# Patient Record
Sex: Female | Born: 1975 | Race: White | Hispanic: No | Marital: Married | Smoking: Never smoker
Health system: Southern US, Community
[De-identification: ages and names within clinical notes are randomized; demographics above are authoritative.]

## PROBLEM LIST (undated history)

## (undated) HISTORY — PX: CHOLECYSTECTOMY: SHX55

---

## 2015-05-21 ENCOUNTER — Emergency Department: Payer: BC Managed Care – PPO

## 2015-05-21 ENCOUNTER — Emergency Department
Admission: EM | Admit: 2015-05-21 | Discharge: 2015-05-21 | Disposition: A | Discharge status: Home / Self Care | Payer: BC Managed Care – PPO | Attending: Emergency Medicine | Admitting: Emergency Medicine

## 2015-05-21 ENCOUNTER — Encounter: Payer: Self-pay | Admitting: Emergency Medicine

## 2015-05-21 DIAGNOSIS — R202 Paresthesia of skin: Secondary | ICD-10-CM | POA: Diagnosis not present

## 2015-05-21 DIAGNOSIS — R079 Chest pain, unspecified: Secondary | ICD-10-CM | POA: Insufficient documentation

## 2015-05-21 LAB — BASIC METABOLIC PANEL
Anion gap: 7 (ref 5–15)
BUN: 12 mg/dL (ref 6–20)
CHLORIDE: 108 mmol/L (ref 101–111)
CO2: 24 mmol/L (ref 22–32)
CREATININE: 0.78 mg/dL (ref 0.44–1.00)
Calcium: 9.3 mg/dL (ref 8.9–10.3)
GFR calc Af Amer: >60 mL/min (ref >60)
GFR calc non Af Amer: >60 mL/min (ref >60)
GLUCOSE: 107 mg/dL — AB (ref 65–99)
Potassium: 3.6 mmol/L (ref 3.5–5.1)
SODIUM: 139 mmol/L (ref 135–145)

## 2015-05-21 LAB — CBC
HEMATOCRIT: 39.7 % (ref 35.0–47.0)
Hemoglobin: 13.5 g/dL (ref 12.0–16.0)
MCH: 31.6 pg (ref 26.0–34.0)
MCHC: 34.1 g/dL (ref 32.0–36.0)
MCV: 92.6 fL (ref 80.0–100.0)
PLATELETS: 228 10*3/uL (ref 150–440)
RBC: 4.29 MIL/uL (ref 3.80–5.20)
RDW: 12.5 % (ref 11.5–14.5)
WBC: 6.3 10*3/uL (ref 3.6–11.0)

## 2015-05-21 LAB — LIPID PANEL
Cholesterol: 138 mg/dL (ref 0–200)
HDL: 56 mg/dL (ref >40)
LDL CALC: 72 mg/dL (ref 0–99)
Total CHOL/HDL Ratio: 2.5 RATIO
Triglycerides: 48 mg/dL (ref <150)
VLDL: 10 mg/dL (ref 0–40)

## 2015-05-21 LAB — TROPONIN I: Troponin I: <0.03 ng/mL (ref <0.031)

## 2015-05-21 NOTE — ED Notes (Signed)
Pt brought in via triage w/ complaints of intermittent chest pain x3 days and a dull ache radiating through left arm. Pt A/Ox4, vital signs WDL, no signs of immediate distress at this time.

## 2015-05-21 NOTE — Discharge Instructions (Signed)
Nonspecific Chest Pain  °Chest pain can be caused by many different conditions. There is always a chance that your pain could be related to something serious, such as a heart attack or a blood clot in your lungs. Chest pain can also be caused by conditions that are not life-threatening. If you have chest pain, it is very important to follow up with your health care provider. °CAUSES  °Chest pain can be caused by: °· Heartburn. °· Pneumonia or bronchitis. °· Anxiety or stress. °· Inflammation around your heart (pericarditis) or lung (pleuritis or pleurisy). °· A blood clot in your lung. °· A collapsed lung (pneumothorax). It can develop suddenly on its own (spontaneous pneumothorax) or from trauma to the chest. °· Shingles infection (varicella-zoster virus). °· Heart attack. °· Damage to the bones, muscles, and cartilage that make up your chest wall. This can include: °¨ Bruised bones due to injury. °¨ Strained muscles or cartilage due to frequent or repeated coughing or overwork. °¨ Fracture to one or more ribs. °¨ Sore cartilage due to inflammation (costochondritis). °RISK FACTORS  °Risk factors for chest pain may include: °· Activities that increase your risk for trauma or injury to your chest. °· Respiratory infections or conditions that cause frequent coughing. °· Medical conditions or overeating that can cause heartburn. °· Heart disease or family history of heart disease. °· Conditions or health behaviors that increase your risk of developing a blood clot. °· Having had chicken pox (varicella zoster). °SIGNS AND SYMPTOMS °Chest pain can feel like: °· Burning or tingling on the surface of your chest or deep in your chest. °· Crushing, pressure, aching, or squeezing pain. °· Dull or sharp pain that is worse when you move, cough, or take a deep breath. °· Pain that is also felt in your back, neck, shoulder, or arm, or pain that spreads to any of these areas. °Your chest pain may come and go, or it may stay  constant. °DIAGNOSIS °Lab tests or other studies may be needed to find the cause of your pain. Your health care provider may have you take a test called an ambulatory ECG (electrocardiogram). An ECG records your heartbeat patterns at the time the test is performed. You may also have other tests, such as: °· Transthoracic echocardiogram (TTE). During echocardiography, sound waves are used to create a picture of all of the heart structures and to look at how blood flows through your heart. °· Transesophageal echocardiogram (TEE). This is a more advanced imaging test that obtains images from inside your body. It allows your health care provider to see your heart in finer detail. °· Cardiac monitoring. This allows your health care provider to monitor your heart rate and rhythm in real time. °· Holter monitor. This is a portable device that records your heartbeat and can help to diagnose abnormal heartbeats. It allows your health care provider to track your heart activity for several days, if needed. °· Stress tests. These can be done through exercise or by taking medicine that makes your heart beat more quickly. °· Blood tests. °· Imaging tests. °TREATMENT  °Your treatment depends on what is causing your chest pain. Treatment may include: °· Medicines. These may include: °¨ Acid blockers for heartburn. °¨ Anti-inflammatory medicine. °¨ Pain medicine for inflammatory conditions. °¨ Antibiotic medicine, if an infection is present. °¨ Medicines to dissolve blood clots. °¨ Medicines to treat coronary artery disease. °· Supportive care for conditions that do not require medicines. This may include: °¨ Resting. °¨ Applying heat   or cold packs to injured areas. °¨ Limiting activities until pain decreases. °HOME CARE INSTRUCTIONS °· If you were prescribed an antibiotic medicine, finish it all even if you start to feel better. °· Avoid any activities that bring on chest pain. °· Do not use any tobacco products, including  cigarettes, chewing tobacco, or electronic cigarettes. If you need help quitting, ask your health care provider. °· Do not drink alcohol. °· Take medicines only as directed by your health care provider. °· Keep all follow-up visits as directed by your health care provider. This is important. This includes any further testing if your chest pain does not go away. °· If heartburn is the cause for your chest pain, you may be told to keep your head raised (elevated) while sleeping. This reduces the chance that acid will go from your stomach into your esophagus. °· Make lifestyle changes as directed by your health care provider. These may include: °¨ Getting regular exercise. Ask your health care provider to suggest some activities that are safe for you. °¨ Eating a heart-healthy diet. A registered dietitian can help you to learn healthy eating options. °¨ Maintaining a healthy weight. °¨ Managing diabetes, if necessary. °¨ Reducing stress. °SEEK MEDICAL CARE IF: °· Your chest pain does not go away after treatment. °· You have a rash with blisters on your chest. °· You have a fever. °SEEK IMMEDIATE MEDICAL CARE IF:  °· Your chest pain is worse. °· You have an increasing cough, or you cough up blood. °· You have severe abdominal pain. °· You have severe weakness. °· You faint. °· You have chills. °· You have sudden, unexplained chest discomfort. °· You have sudden, unexplained discomfort in your arms, back, neck, or jaw. °· You have shortness of breath at any time. °· You suddenly start to sweat, or your skin gets clammy. °· You feel nauseous or you vomit. °· You suddenly feel light-headed or dizzy. °· Your heart begins to beat quickly, or it feels like it is skipping beats. °These symptoms may represent a serious problem that is an emergency. Do not wait to see if the symptoms will go away. Get medical help right away. Call your local emergency services (911 in the [place] Do not drive yourself to the hospital. °  °This  information is not intended to replace advice given to you by your health care provider. Make sure you discuss any questions you have with your health care provider. °  °Document Released: 04/20/2005 Document Revised: 08/01/2014 Document Reviewed: 02/14/2014 °Elsevier Interactive Patient Education ©2016 [employer] ° °

## 2015-05-21 NOTE — ED Notes (Signed)
Pt discharged home after verbalizing understanding of discharge instructions; nad noted. 

## 2015-05-21 NOTE — ED Provider Notes (Signed)
[facility] Emergency Department Provider Note     Time seen: ----------------------------------------- 10:18 AM on 05/21/2015 -----------------------------------------    I have reviewed the triage vital signs and the nursing notes.   HISTORY  Chief Complaint Chest Pain    HPI Tanya Patel is a 39 y.o. female who presents ER with chest pain for 3 days it's intermittent and dull. Patient noted some paresthesias in her left arm today, was concerned and presentsfor further evaluation concerning same. She's not had his this before, nothing makes it better or worse.   No past medical history on file.  There are no active problems to display for this patient.   Past Surgical History  Procedure Laterality Date  . Cholecystectomy      Allergies Codeine; Pineapple; and Sulfa antibiotics  Social History Social History  Substance Use Topics  . Smoking status: Never Smoker   . Smokeless tobacco: Not on file  . Alcohol Use: Yes    Review of Systems Constitutional: Negative for fever. Eyes: Negative for visual changes. ENT: Negative for sore throat. Cardiovascular: Positive for chest pain Respiratory: Negative for shortness of breath. Gastrointestinal: Negative for abdominal pain, vomiting and diarrhea. Genitourinary: Negative for dysuria. Musculoskeletal: Negative for back pain. Skin: Negative for rash. Neurological: Negative for headaches, positive for left arm paresthesia  10-point ROS otherwise negative.  ____________________________________________   PHYSICAL EXAM:  VITAL SIGNS: ED Triage Vitals  Enc Vitals Group     BP 05/21/15 0952 122/81 mmHg     Pulse Rate 05/21/15 0952 62     Resp 05/21/15 0952 20     Temp 05/21/15 0952 98 F (36.7 C)     Temp Source 05/21/15 0952 Oral     SpO2 05/21/15 0952 99 %     Weight 05/21/15 0952 180 lb (81.647 kg)     Height 05/21/15 0952  (1.676 m)     Head Cir --      Peak  Flow --      Pain Score 05/21/15 0952 3     Pain Loc --      Pain Edu? --      Excl. in GC? --     Constitutional: Alert and oriented. Well appearing and in no distress. Eyes: Conjunctivae are normal. PERRL. Normal extraocular movements. ENT   Head: Normocephalic and atraumatic.   Nose: No congestion/rhinnorhea.   Mouth/Throat: Mucous membranes are moist.   Neck: No stridor. Cardiovascular: Normal rate, regular rhythm. Normal and symmetric distal pulses are present in all extremities. No murmurs, rubs, or gallops. Respiratory: Normal respiratory effort without tachypnea nor retractions. Breath sounds are clear and equal bilaterally. No wheezes/rales/rhonchi. Gastrointestinal: Soft and nontender. No distention. No abdominal bruits.  Musculoskeletal: Nontender with normal range of motion in all extremities. No joint effusions.  No lower extremity tenderness nor edema. Neurologic:  Normal speech and language. No gross focal neurologic deficits are appreciated. Speech is normal. No gait instability. Skin:  Skin is warm, dry and intact. No rash noted. Psychiatric: Mood and affect are normal. Speech and behavior are normal. Patient exhibits appropriate insight and judgment. ____________________________________________  EKG: Interpreted by me. Normal sinus rhythm with a rate of 70 bpm, normal PR interval, normal QRS with, normal QT interval.  ____________________________________________  ED COURSE:  Pertinent labs & imaging results that were available during my care of the patient were reviewed by me and considered in my medical decision making (see chart for details).  ____________________________________________    Vickie Epley (  pertinent positives/negatives)  Labs Reviewed  BASIC METABOLIC PANEL - Abnormal; Notable for the following:    Glucose, Bld 107 (*)    All other components within normal limits  CBC  TROPONIN I  LIPID PANEL    RADIOLOGY Images were viewed by  me  Chest x-ray  IMPRESSION: No active cardiopulmonary disease. ____________________________________________  FINAL ASSESSMENT AND PLAN  Chest pain  Plan: Patient with labs and imaging as dictated above. Patient is low risk according to heart score. Her labs including cholesterol panel looked normal. She is stable for outpatient follow-up with her doctor.   Emily FilbertWilliams, Janille Draughon E, MD   Emily FilbertJonathan E Edu On, MD 05/21/15 [phone]

## 2015-05-21 NOTE — ED Notes (Signed)
Pt reports 3 days ago started with some chest pain that is intermittent and achy in nature. Pt reports pain radiates into her left arm and causes numbness. Pt reports just doesn't feel well.

## 2016-11-19 IMAGING — CR DG CHEST 2V
2 series · 2 of 2 positions shown · non-contrast
Comparison: None.

CLINICAL DATA: Chest pain for 3 days

EXAM:
CHEST  2 VIEW

[chest pa]
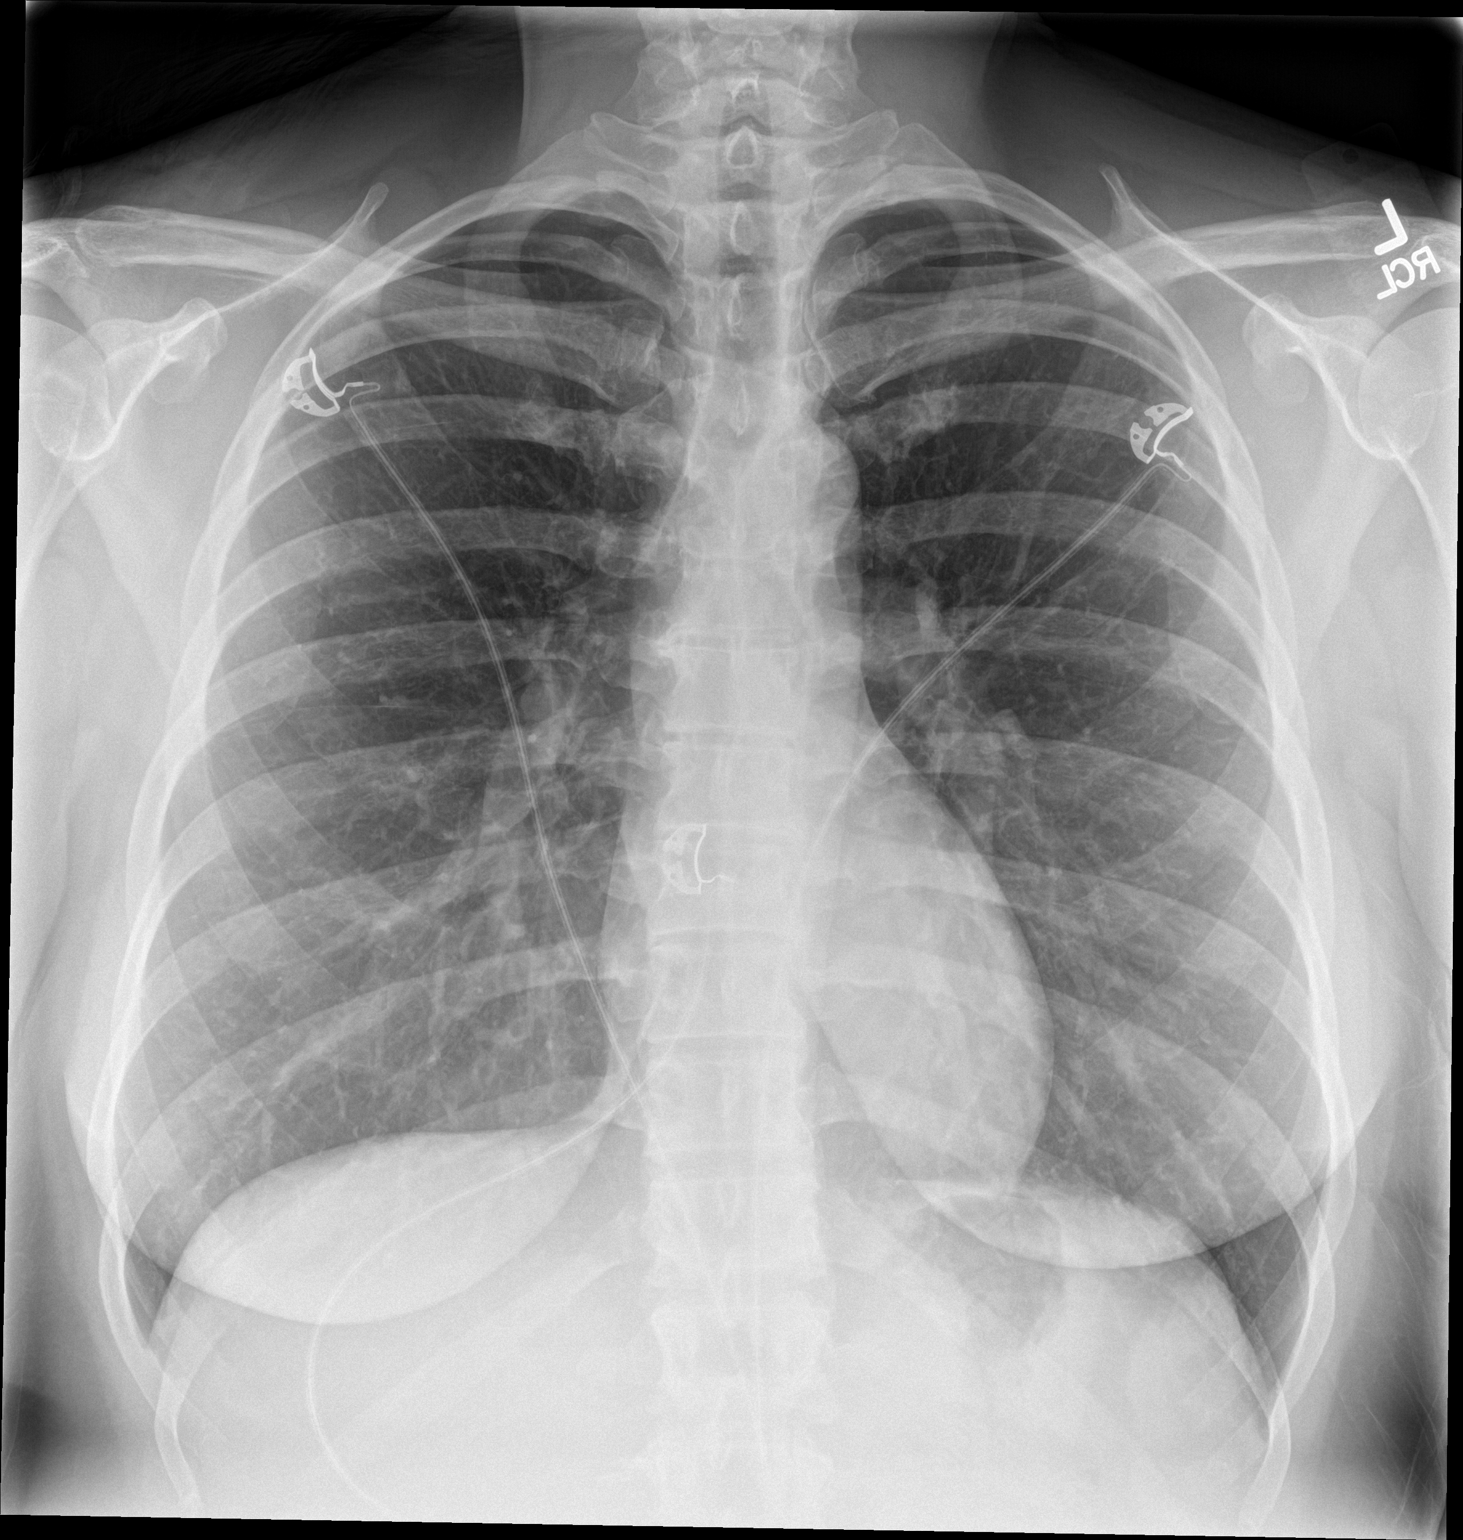

[chest lat]
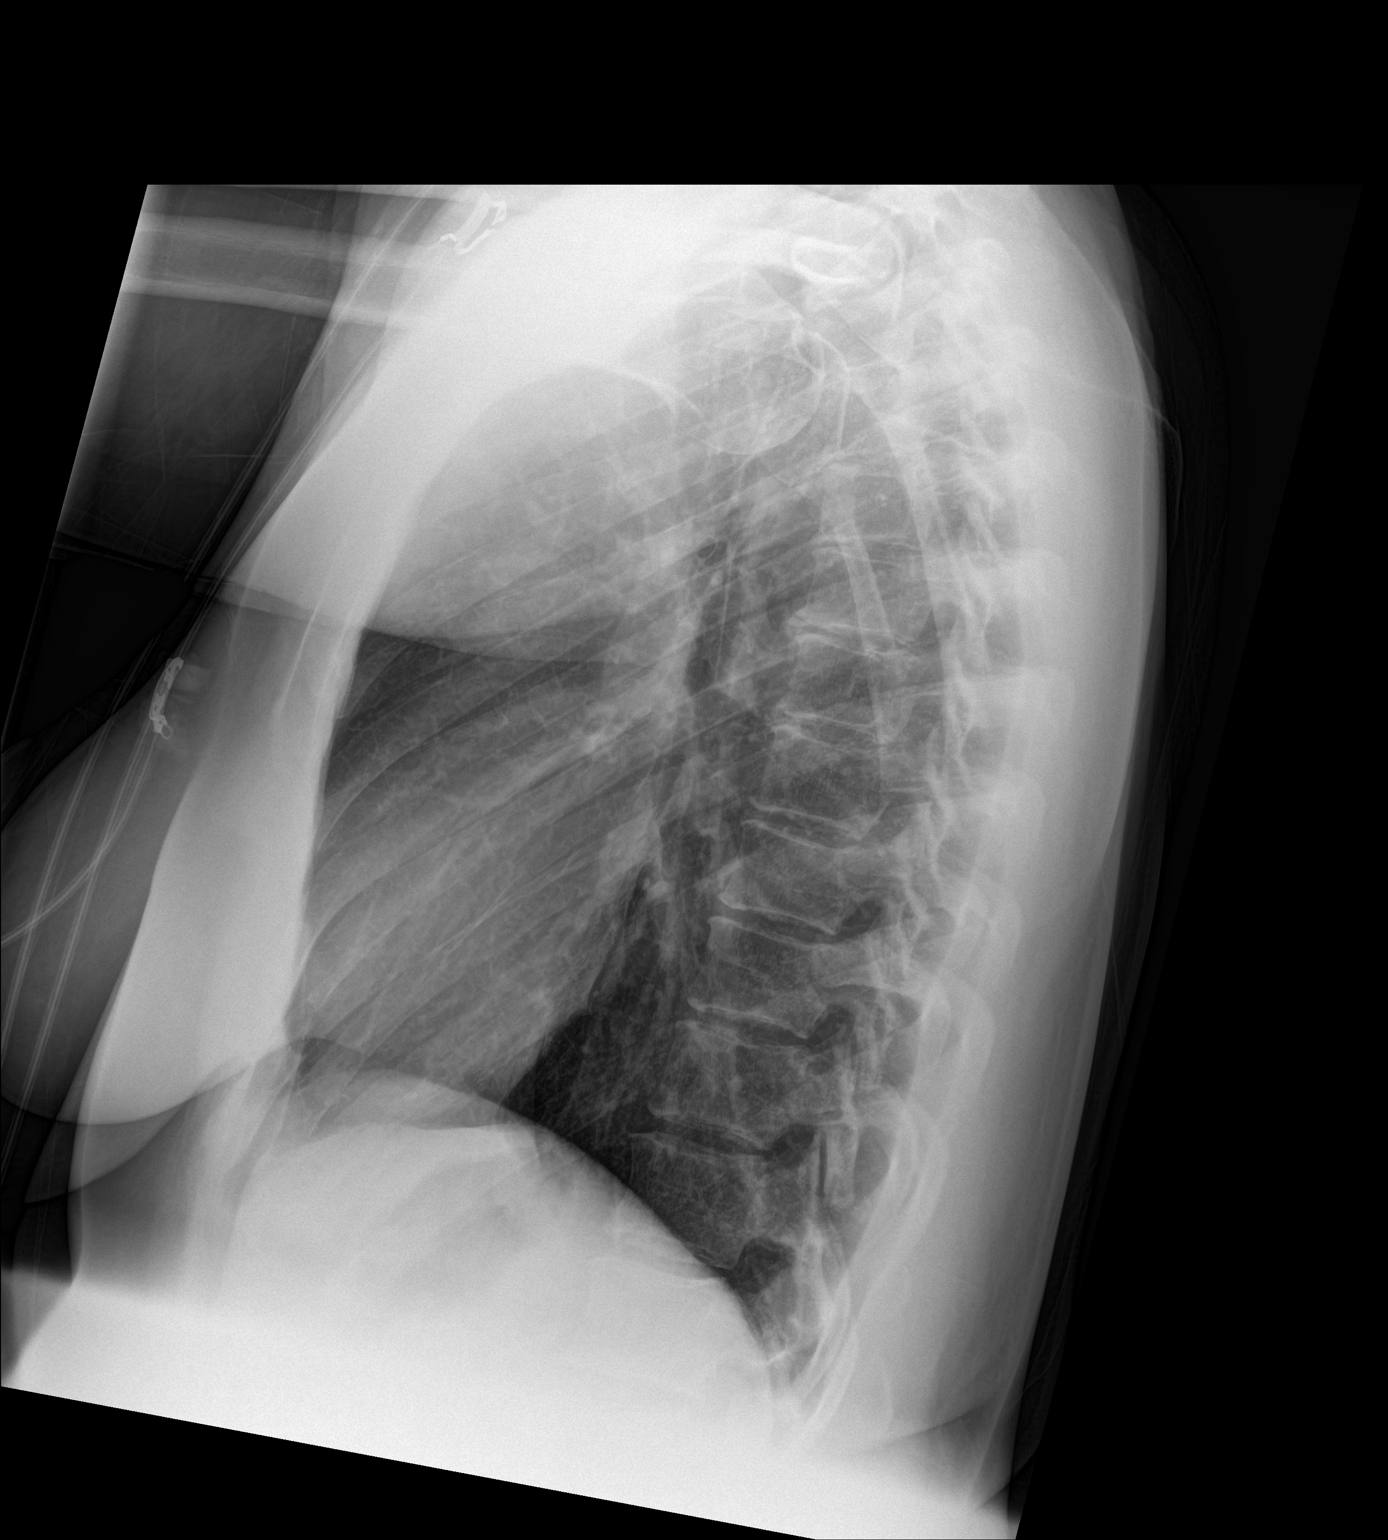

[2 of 2 positions shown; findings below may reference images not displayed]

FINDINGS: The heart size and mediastinal contours are within normal limits.
Both lungs are clear. Mild degenerative changes mid thoracic spine.
IMPRESSION: No active cardiopulmonary disease.
